# Patient Record
Sex: Male | Born: 2000 | Race: White | Hispanic: No | Marital: Single | Smoking: Never smoker
Health system: Southern US, Community
[De-identification: ages and names within clinical notes are randomized; demographics above are authoritative.]

## PROBLEM LIST (undated history)

## (undated) DIAGNOSIS — T7840XA Allergy, unspecified, initial encounter: Secondary | ICD-10-CM

## (undated) HISTORY — DX: Allergy, unspecified, initial encounter: T78.40XA

---

## 2021-05-11 ENCOUNTER — Encounter: Payer: Self-pay | Admitting: Family Medicine

## 2021-05-11 ENCOUNTER — Other Ambulatory Visit: Payer: Self-pay

## 2021-05-11 ENCOUNTER — Ambulatory Visit (INDEPENDENT_AMBULATORY_CARE_PROVIDER_SITE_OTHER): Payer: Medicaid Other | Admitting: Family Medicine

## 2021-05-11 VITALS — BP 131/68 | HR 62 | Ht 70.0 in | Wt 152.2 lb

## 2021-05-11 DIAGNOSIS — R079 Chest pain, unspecified: Secondary | ICD-10-CM

## 2021-05-11 NOTE — Progress Notes (Signed)
Established Patient Office Visit  SUBJECTIVE:  Subjective  Patient ID: Darrell Mcmahon, male    DOB: June 08, 2001  Age: 20 y.o. MRN: [id number]  CC:  Chief Complaint  Patient presents with  . New Patient (Initial Visit)    HPI Darrell Mcmahon is a 20 y.o. male presenting today for     History reviewed. No pertinent past medical history.  History reviewed. No pertinent surgical history.  History reviewed. No pertinent family history.  Social History   Socioeconomic History  . Marital status: Single    Spouse name: Not on file  . Number of children: Not on file  . Years of education: Not on file  . Highest education level: Not on file  Occupational History  . Not on file  Tobacco Use  . Smoking status: Never Smoker  . Smokeless tobacco: Never Used  Substance and Sexual Activity  . Alcohol use: Never  . Drug use: Never  . Sexual activity: Yes  Other Topics Concern  . Not on file  Social History Narrative  . Not on file   Social Determinants of Health   Financial Resource Strain: Not on file  Food Insecurity: Not on file  Transportation Needs: Not on file  Physical Activity: Not on file  Stress: Not on file  Social Connections: Not on file  Intimate Partner Violence: Not on file    No current outpatient medications on file.   Allergies  Allergen Reactions  . Penicillins     ROS Review of Systems  Constitutional: Negative.   HENT: Negative.   Respiratory: Negative.   Cardiovascular: Positive for chest pain.  Genitourinary: Negative.   Neurological: Negative.   Psychiatric/Behavioral: Negative.      OBJECTIVE:    Physical Exam HENT:     Head: Normocephalic and atraumatic.     Mouth/Throat:     Mouth: Mucous membranes are moist.  Cardiovascular:     Rate and Rhythm: Normal rate and regular rhythm.  Musculoskeletal:        General: Normal range of motion.  Skin:    General: Skin is warm.     Capillary Refill: Capillary refill takes less  than 2 seconds.  Neurological:     Mental Status: He is alert.  Psychiatric:        Mood and Affect: Mood normal.        Thought Content: Thought content normal.     BP 131/68   Pulse 62   Ht '5\' 10"'  (1.778 m)   Wt 152 lb 3.2 oz (69 kg)   BMI 21.84 kg/m  Wt Readings from Last 3 Encounters:  05/11/21 152 lb 3.2 oz (69 kg)    Health Maintenance Due  Topic Date Due  . HPV VACCINES (1 - Male 2-dose series) Never done  . HIV Screening  Never done  . Hepatitis C Screening  Never done  . TETANUS/TDAP  Never done       Topic Date Due  . HPV VACCINES (1 - Male 2-dose series) Never done    No flowsheet data found. No flowsheet data found.  No results found for: TSH No results found for: ALBUMIN, ANIONGAP, EGFR, GFR No results found for: CHOL, HDL, LDLCALC, CHOLHDL No results found for: TRIG No results found for: HGBA1C    ASSESSMENT & PLAN:   Problem List Items Addressed This Visit      Other   Chest pain - Primary    Patient with sharp chest pain for over 1  year now, happens intermittently and will last up to 1 hour. Sometimes it improves when he relaxes and breathes through the pain. He has had a workup in [place] including Echo and GERD meds that did not improve the pain or reveal any cause.   Plan- Chest X ray, labs If the results are inconclusive may try an SSRI although patient denies anxiety.       Relevant Orders   EKG 12-Lead   DG Chest 2 View   CBC with Differential   COMPLETE METABOLIC PANEL WITH GFR      No orders of the defined types were placed in this encounter.     Follow-up: No follow-ups on file.    Beckie Salts, [place] [address], [place], [place] [address]

## 2021-05-11 NOTE — Assessment & Plan Note (Signed)
Patient with sharp chest pain for over 1 year now, happens intermittently and will last up to 1 hour. Sometimes it improves when he relaxes and breathes through the pain. He has had a workup in [place] including Echo and GERD meds that did not improve the pain or reveal any cause.   Plan- Chest X ray, labs If the results are inconclusive may try an SSRI although patient denies anxiety.

## 2021-05-12 LAB — COMPLETE METABOLIC PANEL WITH GFR
AG Ratio: 1.7 (calc) (ref 1.0–2.5)
ALT: 92 U/L — ABNORMAL HIGH (ref 9–46)
AST: 219 U/L — ABNORMAL HIGH (ref 10–40)
Albumin: 4.1 g/dL (ref 3.6–5.1)
Alkaline phosphatase (APISO): 65 U/L (ref 36–130)
BUN: 14 mg/dL (ref 7–25)
CO2: 26 mmol/L (ref 20–32)
Calcium: 8.9 mg/dL (ref 8.6–10.3)
Chloride: 106 mmol/L (ref 98–110)
Creat: 0.98 mg/dL (ref 0.60–1.35)
GFR, Est African American: 128 mL/min/{1.73_m2} (ref >=60)
GFR, Est Non African American: 111 mL/min/{1.73_m2} (ref >=60)
Globulin: 2.4 g/dL (calc) (ref 1.9–3.7)
Glucose, Bld: 91 mg/dL (ref 65–99)
Potassium: 3.9 mmol/L (ref 3.5–5.3)
Sodium: 140 mmol/L (ref 135–146)
Total Bilirubin: 1.1 mg/dL (ref 0.2–1.2)
Total Protein: 6.5 g/dL (ref 6.1–8.1)

## 2021-05-12 LAB — CBC WITH DIFFERENTIAL/PLATELET
Absolute Monocytes: 402 cells/uL (ref 200–950)
Basophils Absolute: 41 cells/uL (ref 0–200)
Basophils Relative: 1 %
Eosinophils Absolute: 139 cells/uL (ref 15–500)
Eosinophils Relative: 3.4 %
HCT: 43.8 % (ref 38.5–50.0)
Hemoglobin: 14.6 g/dL (ref 13.2–17.1)
Lymphs Abs: 1394 cells/uL (ref 850–3900)
MCH: 28.9 pg (ref 27.0–33.0)
MCHC: 33.3 g/dL (ref 32.0–36.0)
MCV: 86.7 fL (ref 80.0–100.0)
MPV: 10.9 fL (ref 7.5–12.5)
Monocytes Relative: 9.8 %
Neutro Abs: 2124 cells/uL (ref 1500–7800)
Neutrophils Relative %: 51.8 %
Platelets: 193 10*3/uL (ref 140–400)
RBC: 5.05 10*6/uL (ref 4.20–5.80)
RDW: 12.3 % (ref 11.0–15.0)
Total Lymphocyte: 34 %
WBC: 4.1 10*3/uL (ref 3.8–10.8)

## 2021-05-16 ENCOUNTER — Encounter: Payer: Self-pay | Admitting: Family Medicine

## 2021-05-18 ENCOUNTER — Other Ambulatory Visit (INDEPENDENT_AMBULATORY_CARE_PROVIDER_SITE_OTHER): Payer: Medicaid Other | Admitting: Family Medicine

## 2021-05-18 ENCOUNTER — Ambulatory Visit
Admission: RE | Admit: 2021-05-18 | Discharge: 2021-05-18 | Disposition: A | Discharge status: Home / Self Care | Payer: Medicaid Other | Source: Ambulatory Visit | Attending: Family Medicine | Admitting: Family Medicine

## 2021-05-18 ENCOUNTER — Other Ambulatory Visit: Payer: Self-pay

## 2021-05-18 DIAGNOSIS — R748 Abnormal levels of other serum enzymes: Secondary | ICD-10-CM

## 2021-05-18 LAB — COMPLETE METABOLIC PANEL WITH GFR
AG Ratio: 1.8 (calc) (ref 1.0–2.5)
ALT: 63 U/L — ABNORMAL HIGH (ref 9–46)
AST: 27 U/L (ref 10–40)
Albumin: 4.4 g/dL (ref 3.6–5.1)
Alkaline phosphatase (APISO): 72 U/L (ref 36–130)
BUN: 15 mg/dL (ref 7–25)
CO2: 28 mmol/L (ref 20–32)
Calcium: 9.5 mg/dL (ref 8.6–10.3)
Chloride: 102 mmol/L (ref 98–110)
Creat: 1.11 mg/dL (ref 0.60–1.35)
GFR, Est African American: 110 mL/min/{1.73_m2} (ref >=60)
GFR, Est Non African American: 95 mL/min/{1.73_m2} (ref >=60)
Globulin: 2.4 g/dL (calc) (ref 1.9–3.7)
Glucose, Bld: 100 mg/dL — ABNORMAL HIGH (ref 65–99)
Potassium: 4.3 mmol/L (ref 3.5–5.3)
Sodium: 138 mmol/L (ref 135–146)
Total Bilirubin: 1 mg/dL (ref 0.2–1.2)
Total Protein: 6.8 g/dL (ref 6.1–8.1)

## 2021-05-18 LAB — AMYLASE: Amylase: 42 U/L (ref 21–101)

## 2021-05-18 LAB — LIPID PANEL
Cholesterol: 128 mg/dL (ref <200)
HDL: 45 mg/dL (ref >=40)
LDL Cholesterol (Calc): 66 mg/dL (calc)
Non-HDL Cholesterol (Calc): 83 mg/dL (calc) (ref <130)
Total CHOL/HDL Ratio: 2.8 (calc) (ref <5.0)
Triglycerides: 88 mg/dL (ref <150)

## 2021-05-18 LAB — LIPASE: Lipase: 19 U/L (ref 7–60)

## 2021-06-08 ENCOUNTER — Ambulatory Visit: Payer: Medicaid Other | Admitting: Family Medicine

## 2021-06-08 IMAGING — US US ABDOMEN COMPLETE
1 series · 14 of 25 positions shown · non-contrast
Comparison: None.

CLINICAL DATA: Elevated LFTs

EXAM:
ABDOMEN ULTRASOUND COMPLETE

[Series 1: us abdomen complete · 14 of 110 slices shown]
[im 1/110]
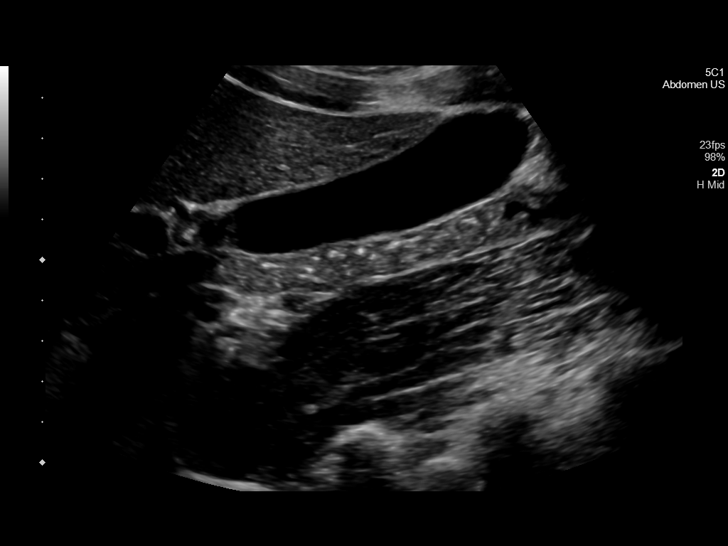
[im 10/110]
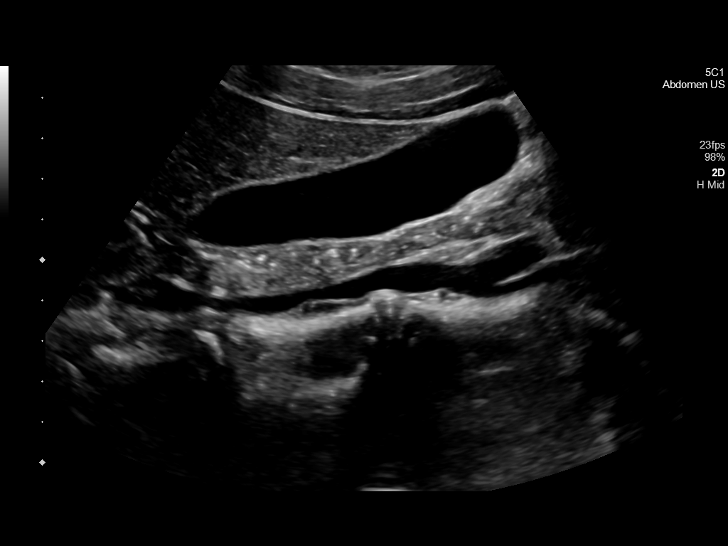
[im 19/110]
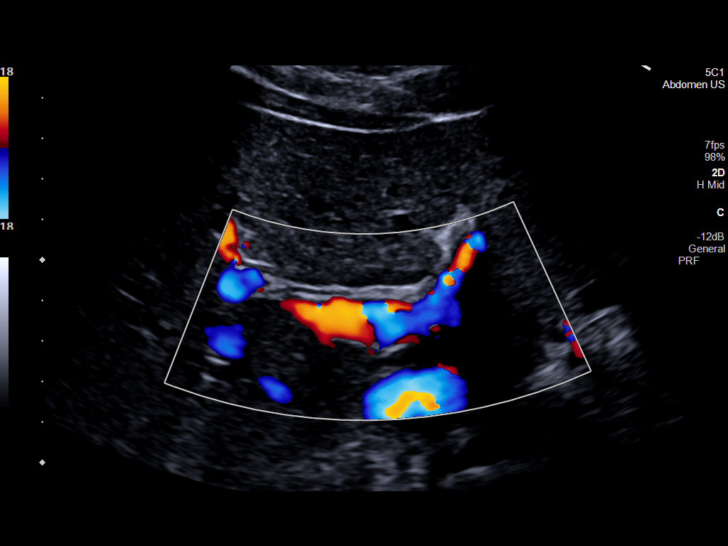
[im 28/110]
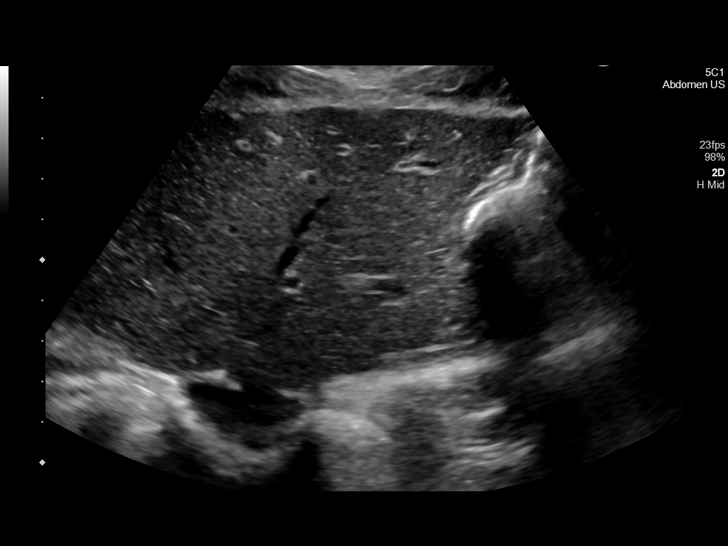
[im 37/110]
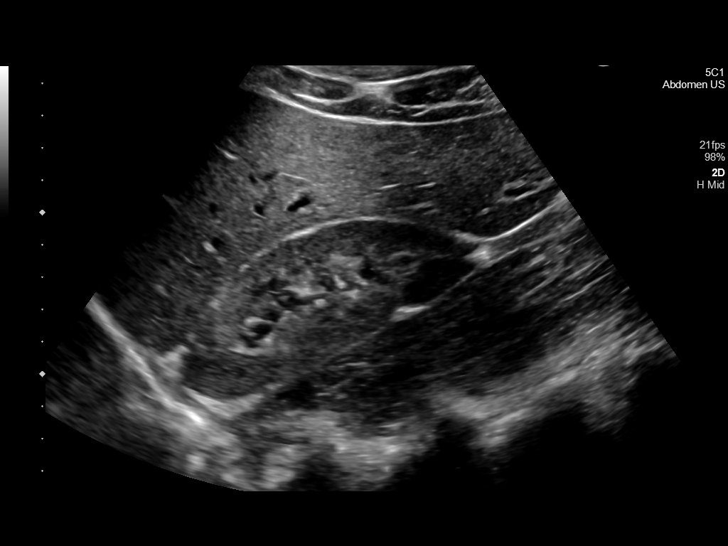
[im 41/110]
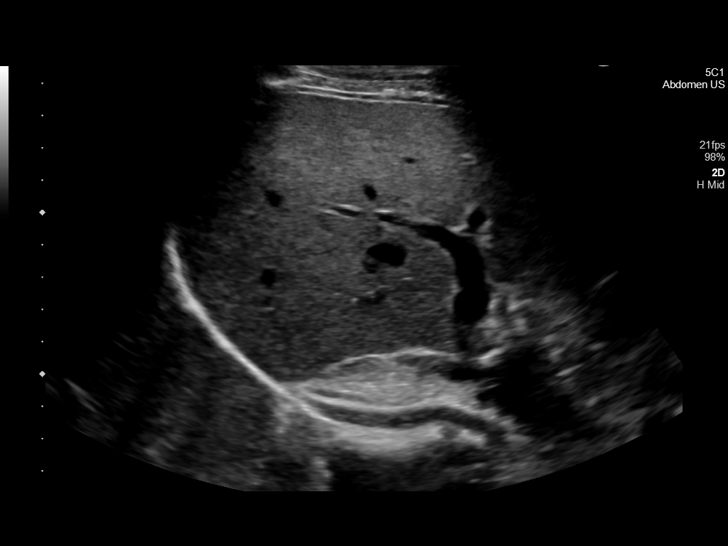
[im 50/110]
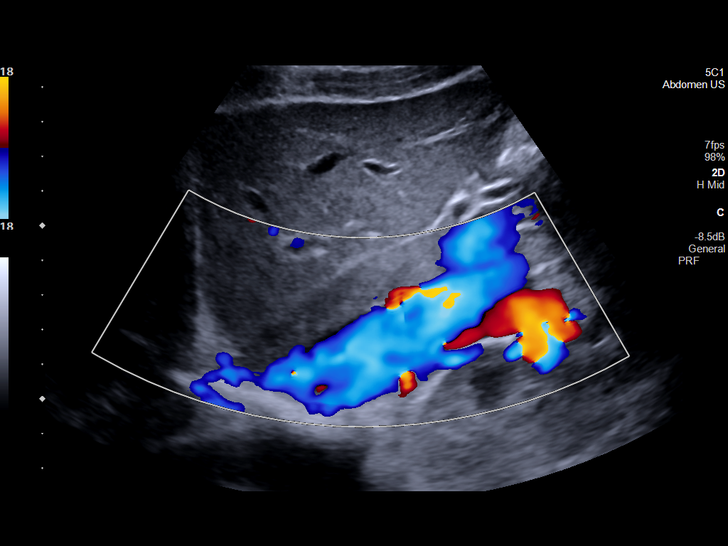
[im 60/110]
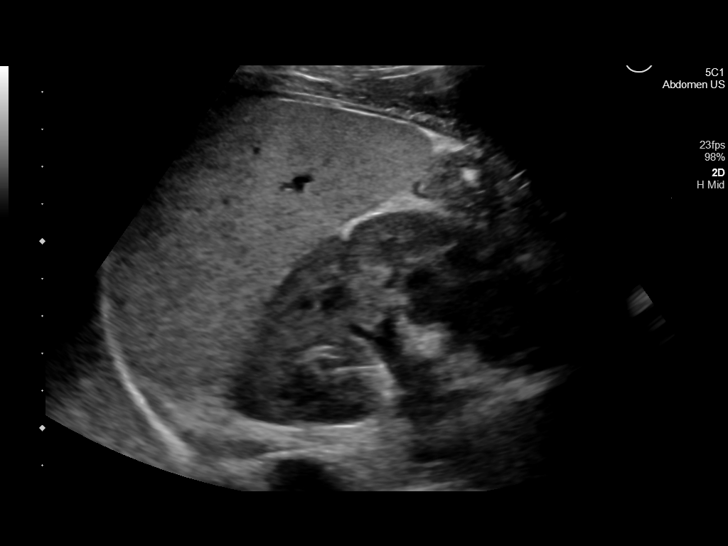
[im 69/110]
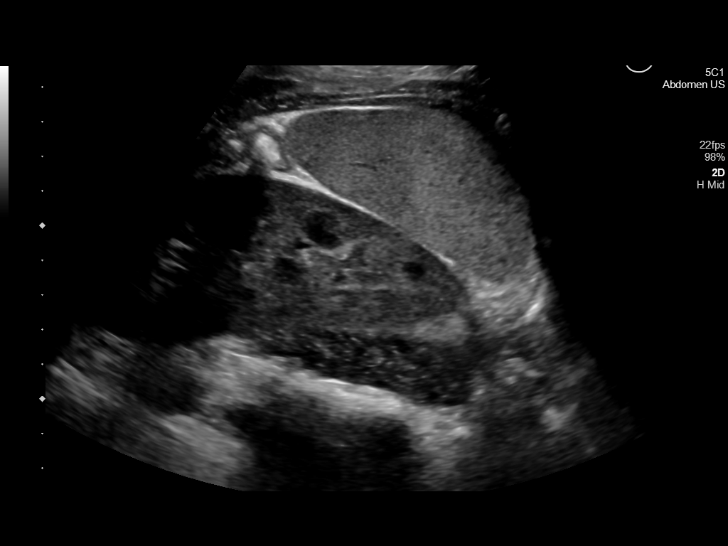
[im 73/110]
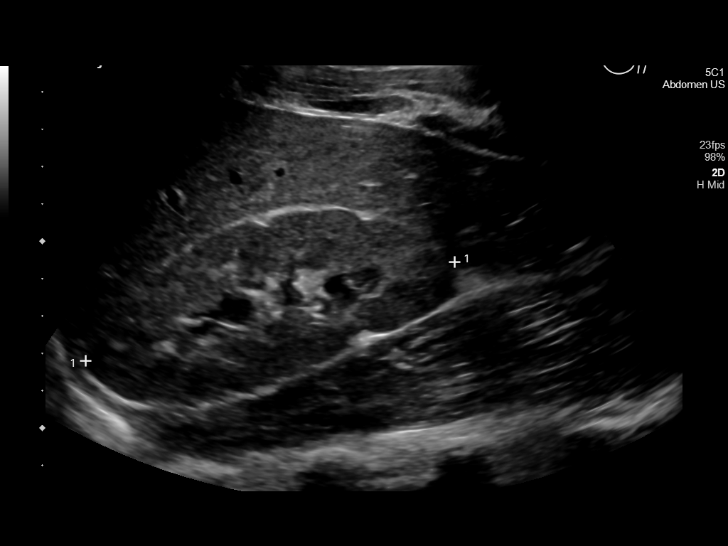
[im 82/110]
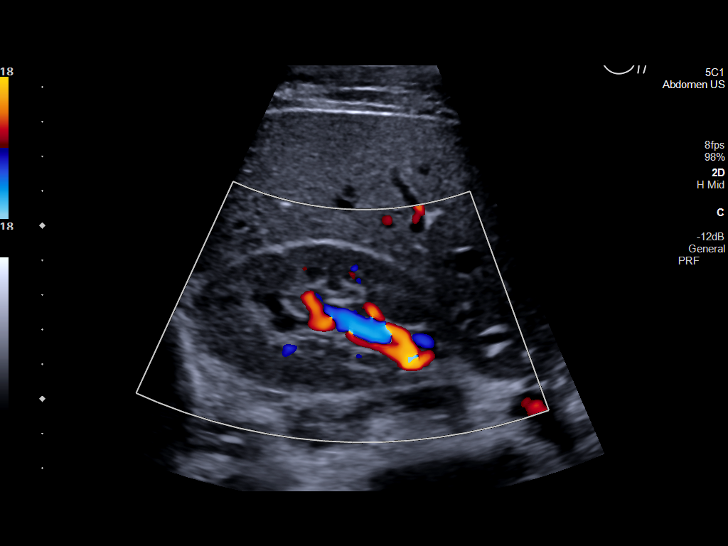
[im 91/110]
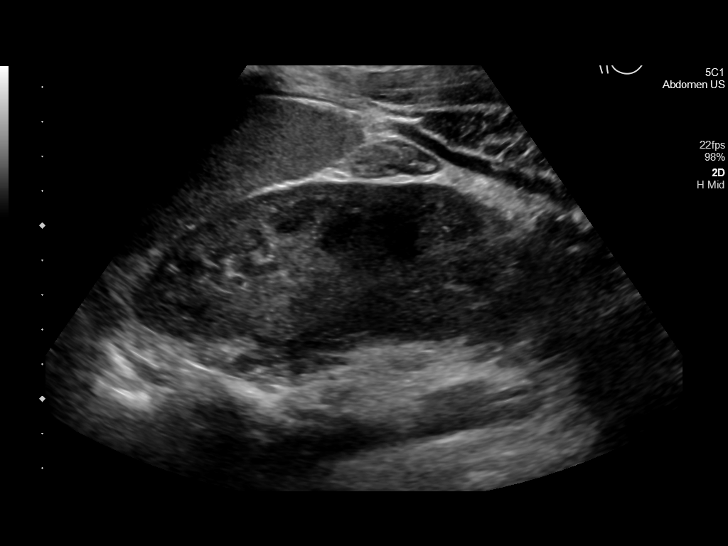
[im 100/110]
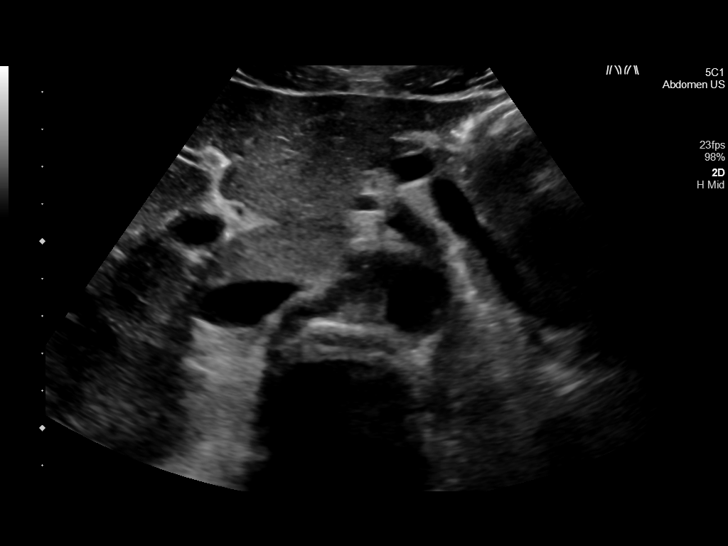
[im 110/110]
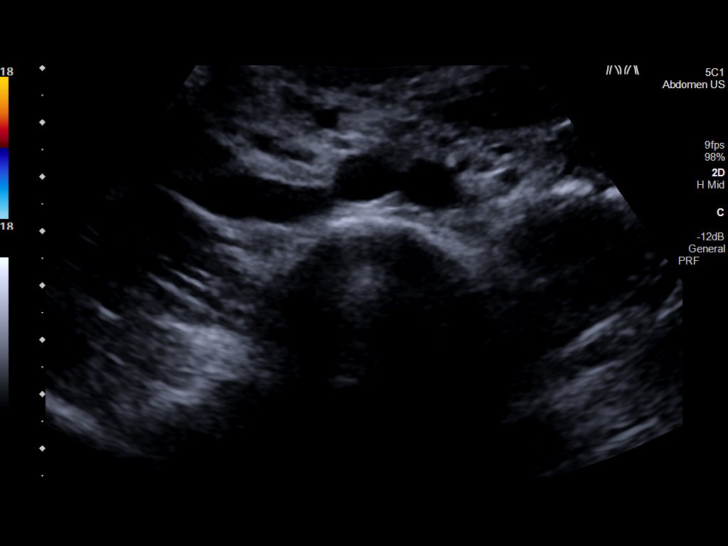

[14 of 25 positions shown; findings below may reference images not displayed]

FINDINGS: Gallbladder: No gallstones or wall thickening visualized. No
sonographic Murphy sign noted by sonographer.

Common bile duct: Diameter: 2 mm

Liver: No focal lesion identified. Within normal limits in
parenchymal echogenicity. Portal vein is patent on color Doppler
imaging with normal direction of blood flow towards the liver.

IVC: No abnormality visualized.

Pancreas: Visualized portion unremarkable.

Spleen: Size and appearance within normal limits.

Right Kidney: Length: 10.2 cm. Echogenicity within normal limits. No
mass or hydronephrosis visualized.

Left Kidney: Length: 11.2 cm. Echogenicity within normal limits. No
mass or hydronephrosis visualized.

Abdominal aorta: No aneurysm visualized.

Other findings: None.
IMPRESSION: Unremarkable ultrasound of the abdomen.

## 2022-01-10 ENCOUNTER — Encounter: Payer: Self-pay | Admitting: Internal Medicine

## 2022-01-18 ENCOUNTER — Other Ambulatory Visit: Payer: Self-pay | Admitting: Internal Medicine

## 2022-01-18 DIAGNOSIS — R079 Chest pain, unspecified: Secondary | ICD-10-CM

## 2022-01-21 ENCOUNTER — Ambulatory Visit: Admission: RE | Admit: 2022-01-21 | Payer: Medicaid Other | Source: Ambulatory Visit

## 2022-01-28 ENCOUNTER — Ambulatory Visit: Admission: RE | Admit: 2022-01-28 | Payer: Medicaid Other | Source: Ambulatory Visit

## 2022-02-04 ENCOUNTER — Ambulatory Visit: Admission: RE | Admit: 2022-02-04 | Payer: Medicaid Other | Source: Ambulatory Visit

## 2022-02-15 ENCOUNTER — Other Ambulatory Visit: Payer: Medicaid Other

## 2022-02-15 ENCOUNTER — Ambulatory Visit
Admission: RE | Admit: 2022-02-15 | Discharge: 2022-02-15 | Disposition: A | Discharge status: Home / Self Care | Payer: Medicaid Other | Source: Ambulatory Visit | Attending: Internal Medicine | Admitting: Internal Medicine

## 2022-02-15 ENCOUNTER — Other Ambulatory Visit: Payer: Self-pay | Admitting: Internal Medicine

## 2022-02-15 DIAGNOSIS — R079 Chest pain, unspecified: Secondary | ICD-10-CM

## 2022-03-19 ENCOUNTER — Other Ambulatory Visit: Payer: Self-pay | Admitting: Nurse Practitioner

## 2022-03-19 DIAGNOSIS — R7989 Other specified abnormal findings of blood chemistry: Secondary | ICD-10-CM

## 2022-03-19 DIAGNOSIS — R0789 Other chest pain: Secondary | ICD-10-CM

## 2022-03-28 ENCOUNTER — Ambulatory Visit: Admission: RE | Admit: 2022-03-28 | Payer: Medicaid Other | Source: Ambulatory Visit

## 2022-04-03 ENCOUNTER — Encounter: Payer: Self-pay | Admitting: Emergency Medicine

## 2022-04-03 ENCOUNTER — Emergency Department
Admission: EM | Admit: 2022-04-03 | Discharge: 2022-04-03 | Disposition: A | Discharge status: Home / Self Care | Payer: Medicaid Other | Attending: Student in an Organized Health Care Education/Training Program | Admitting: Student in an Organized Health Care Education/Training Program

## 2022-04-03 ENCOUNTER — Other Ambulatory Visit: Payer: Self-pay

## 2022-04-03 ENCOUNTER — Emergency Department: Payer: Medicaid Other

## 2022-04-03 DIAGNOSIS — R0789 Other chest pain: Secondary | ICD-10-CM | POA: Diagnosis present

## 2022-04-03 LAB — CBC
HCT: 44 % (ref 39.0–52.0)
Hemoglobin: 15.1 g/dL (ref 13.0–17.0)
MCH: 29.2 pg (ref 26.0–34.0)
MCHC: 34.3 g/dL (ref 30.0–36.0)
MCV: 85.1 fL (ref 80.0–100.0)
Platelets: 173 10*3/uL (ref 150–400)
RBC: 5.17 MIL/uL (ref 4.22–5.81)
RDW: 11.9 % (ref 11.5–15.5)
WBC: 4.2 10*3/uL (ref 4.0–10.5)
nRBC: 0 % (ref 0.0–0.2)

## 2022-04-03 LAB — COMPREHENSIVE METABOLIC PANEL
ALT: 21 U/L (ref 0–44)
AST: 17 U/L (ref 15–41)
Albumin: 4 g/dL (ref 3.5–5.0)
Alkaline Phosphatase: 58 U/L (ref 38–126)
Anion gap: 5 (ref 5–15)
BUN: 13 mg/dL (ref 6–20)
CO2: 26 mmol/L (ref 22–32)
Calcium: 8.9 mg/dL (ref 8.9–10.3)
Chloride: 106 mmol/L (ref 98–111)
Creatinine, Ser: 0.94 mg/dL (ref 0.61–1.24)
GFR, Estimated: >60 mL/min (ref >60)
Glucose, Bld: 104 mg/dL — ABNORMAL HIGH (ref 70–99)
Potassium: 4.1 mmol/L (ref 3.5–5.1)
Sodium: 137 mmol/L (ref 135–145)
Total Bilirubin: 0.9 mg/dL (ref 0.3–1.2)
Total Protein: 7 g/dL (ref 6.5–8.1)

## 2022-04-03 LAB — LIPASE, BLOOD: Lipase: 35 U/L (ref 11–51)

## 2022-04-03 LAB — TROPONIN I (HIGH SENSITIVITY): Troponin I (High Sensitivity): 4 ng/L (ref <18)

## 2022-04-03 MED ORDER — NAPROXEN 500 MG PO TABS
500.0000 mg | ORAL_TABLET | Freq: Once | ORAL | Status: AC
  Administered 2022-04-03: 500 mg via ORAL
  Filled 2022-04-03: qty 1

## 2022-04-03 NOTE — ED Provider Notes (Signed)
? ?[facility] ?Provider Note ? ? ? Event Date/Time  ? First MD Initiated Contact with Patient 04/03/22 [phone]   ?  (approximate) ? ? ?History  ? ?Chest Pain ? ? ?HPI ? ?Darrell Mcmahon is a 21 y.o. male with a history of recurrent chest wall pain presents to the ER for evaluation of left-sided chest pain for the past 2 days.  No fevers no pleuritic pain.  Is worse with movement.  Has been evaluated by cardiology but no noncardiac chest pain.  Recently placed on famotidine for suspected gastritis.  Denies any epigastric pain no lower abdominal pain.  No pain radiating through to the back.  Denies any injury no fevers or chills denies any history of asthma or bronchitis.  Does not smoke. ?  ? ? ?Physical Exam  ? ?Triage Vital Signs: ?ED Triage Vitals [04/03/22 0834]  ?Enc Vitals Group  ?   BP 127/76  ?   Pulse Rate 70  ?   Resp 20  ?   Temp 98.3 ?F (36.8 ?C)  ?   Temp Source Oral  ?   SpO2 100 %  ?   Weight 158 lb (71.7 kg)  ?   Height 5\' 10"  (1.778 m)  ?   Head Circumference   ?   Peak Flow   ?   Pain Score 8  ?   Pain Loc   ?   Pain Edu?   ?   Excl. in [place]?   ? ? ?Most recent vital signs: ?Vitals:  ? 04/03/22 0834 04/03/22 0949  ?BP: 127/76 130/70  ?Pulse: 70 72  ?Resp: 20 18  ?Temp: 98.3 ?F (36.8 ?C)   ?SpO2: 100% 100%  ? ? ? ?Constitutional: Alert  ?Eyes: Conjunctivae are normal.  ?Head: Atraumatic. ?Nose: No congestion/rhinnorhea. ?Mouth/Throat: Mucous membranes are moist.   ?Neck: Painless ROM.  ?Cardiovascular:   Good peripheral circulation. No m/g/r ?Respiratory: Normal respiratory effort.  No retractions. No crackles, no wheeze ?Gastrointestinal: Soft and nontender.  ?Musculoskeletal:  no deformity ?Neurologic:  MAE spontaneously. No gross focal neurologic deficits are appreciated.  ?Skin:  Skin is warm, dry and intact. No rash noted. ?Psychiatric: Mood and affect are normal. Speech and behavior are normal. ? ? ? ?ED Results / Procedures / Treatments  ? ?Labs ?(all labs ordered are listed,  but only abnormal results are displayed) ?Labs Reviewed  ?COMPREHENSIVE METABOLIC PANEL - Abnormal; Notable for the following components:  ?    Result Value  ? Glucose, Bld 104 (*)   ? All other components within normal limits  ?CBC  ?LIPASE, BLOOD  ?TROPONIN I (HIGH SENSITIVITY)  ? ? ? ?EKG ? ?ED ECG REPORT ?I, Merlyn Lot, the attending physician, personally viewed and interpreted this ECG. ? ? Date: 04/03/2022 ? EKG Time: 8:31 ? Rate: 70 ? Rhythm: sinus ? Axis: right ? Intervals:normal ? ST&T Change: no brugada or wpw, no stemi, no depression, unchanged from previous tracing ? ? ? ?RADIOLOGY ?Please see ED Course for my review and interpretation. ? ?I personally reviewed all radiographic images ordered to evaluate for the above acute complaints and reviewed radiology reports and findings.  These findings were personally discussed with the patient.  Please see medical record for radiology report. ? ? ? ?PROCEDURES: ? ?Critical Care performed:  ? ?Procedures ? ? ?MEDICATIONS ORDERED IN ED: ?Medications  ?naproxen (NAPROSYN) tablet 500 mg (500 mg Oral Given 04/03/22 0916)  ? ? ? ?IMPRESSION / MDM / ASSESSMENT AND PLAN /  ED COURSE  ?I reviewed the triage vital signs and the nursing notes. ?             ?               ? ?Differential diagnosis includes, but is not limited to, ACS, pericarditis, esophagitis,, pe, dissection, pna, bronchitis, costochondritis ? ?Patient presented to ER with symptoms as described above.  Clinically is not consistent with dissection or ACS.  No findings to suggest bronchitis.  Possible esophagitis or gastritis given his history of reflux but seems very reproducible with movement.  Possible pleurisy.  Patient is low risk by Wells criteria and is PERC negative.  Not consistent with PE.  EKG unchanged from previous and has had reassuring cardiac work-up. ? ?Clinical Course as of 04/03/22 0952  ?Wed Apr 03, 2022  ?0916 This x-ray by my review and interpretation does not show any evidence  of pneumothorax.  No cardiomegaly. [PR]  ?  ?Clinical Course User Index ?[PR] Merlyn Lot, MD  ? ?Blood work reassuring troponin normal LFTs normal lipase normal.  This is most likely musculoskeletal pain versus pleurisy does appear stable and appropriate for outpatient follow-up. ? ? ?FINAL CLINICAL IMPRESSION(S) / ED DIAGNOSES  ? ?Final diagnoses:  ?Chest wall pain  ? ? ? ?Rx / DC Orders  ? ?ED Discharge Orders   ? ? None  ? ?  ? ? ? ?Note:  This document was prepared using Dragon voice recognition software and may include unintentional dictation errors. ? ?  ?Merlyn Lot, MD ?04/03/22 [phone] ? ?

## 2022-04-03 NOTE — ED Triage Notes (Signed)
Pt in with co left sided chest pain for years, states has been evaluated for the same and tx for reflux. Pt states pain worse with certain movements.  ?

## 2022-04-24 IMAGING — CR DG CHEST 2V
2 series · 2 of 2 positions shown · non-contrast
Comparison: 02/15/2022

CLINICAL DATA: Left-sided chest pain.  Evaluate for pneumothorax.

EXAM:
CHEST - 2 VIEW

[chest pa]
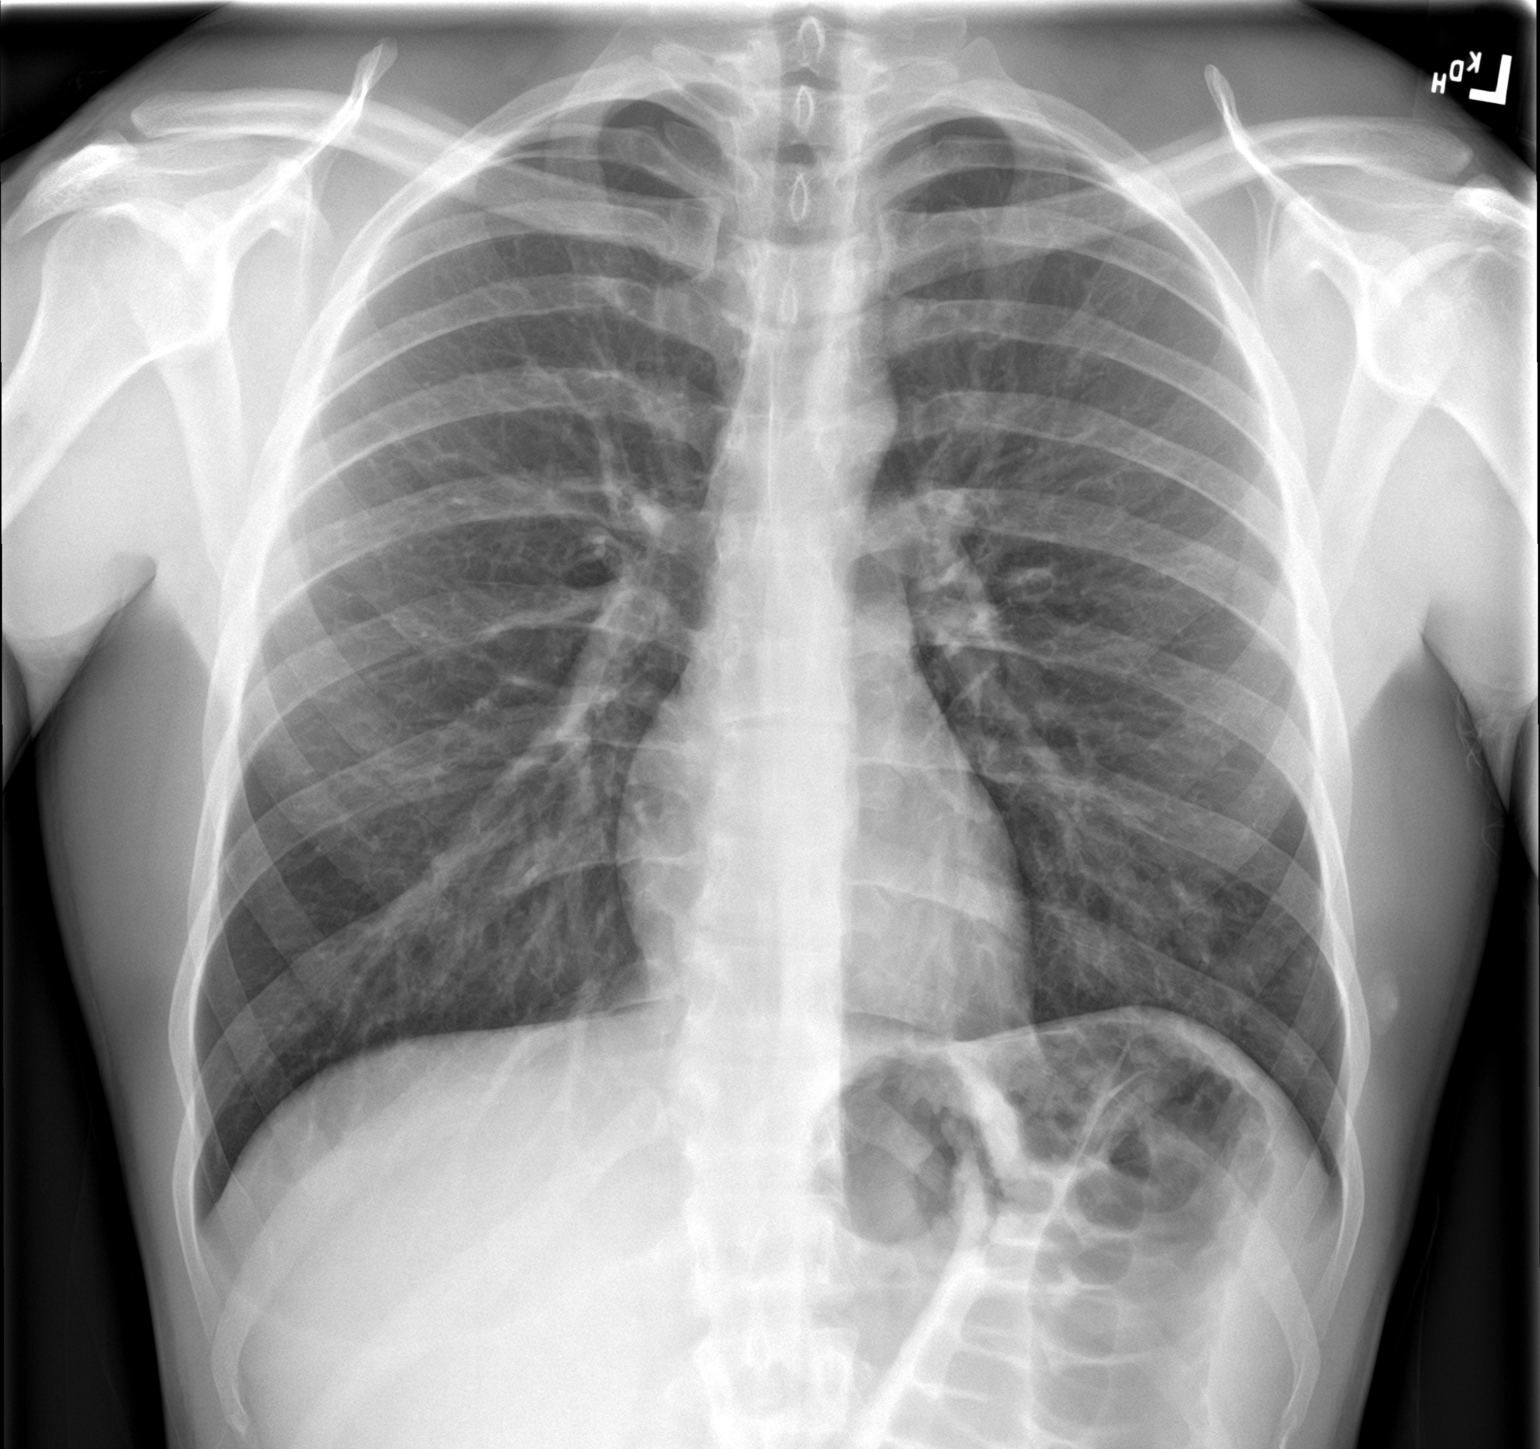

[chest lat]
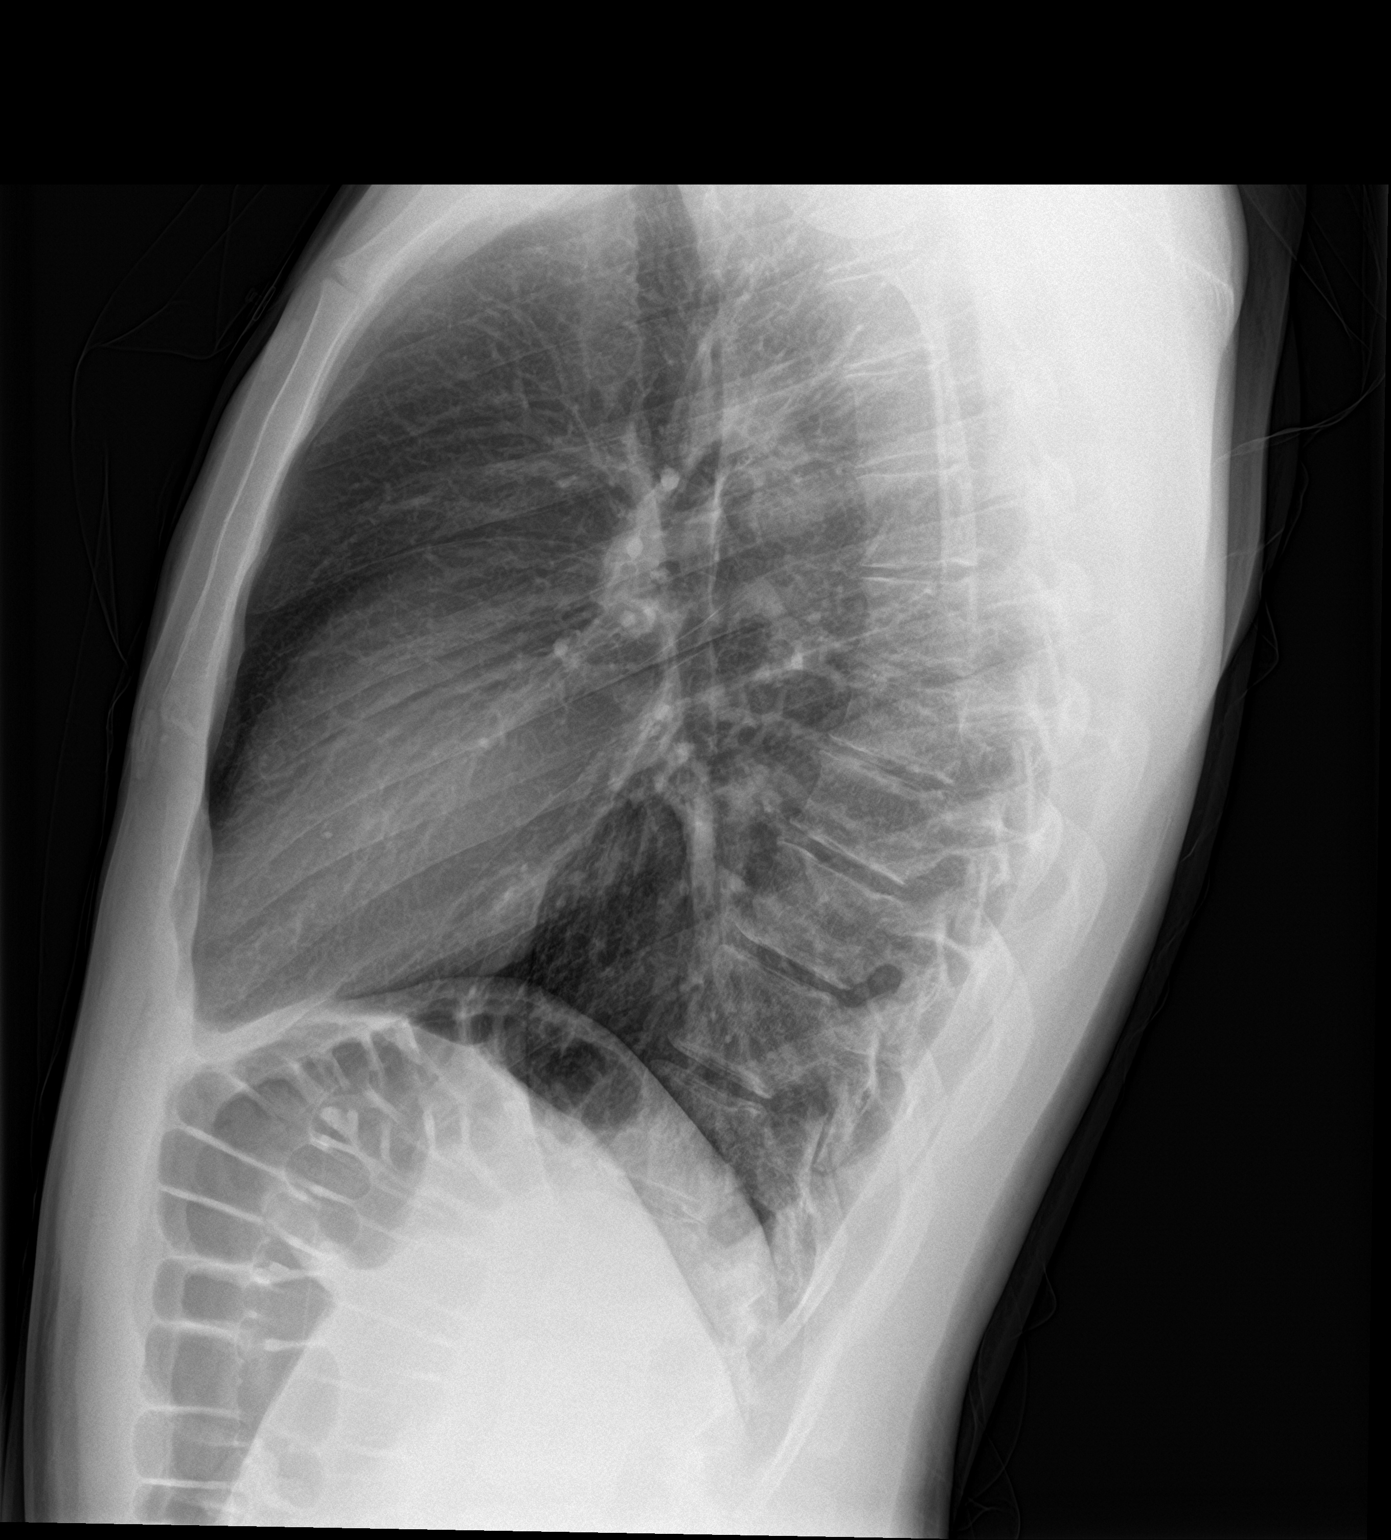

[2 of 2 positions shown; findings below may reference images not displayed]

FINDINGS: Grossly unchanged cardiac silhouette and mediastinal contours. No
focal parenchymal opacities. No pleural effusion or pneumothorax. No
evidence of edema. No acute osseous abnormalities. Mild gaseous
distention of the splenic flexure of the colon.
IMPRESSION: 1. No acute cardiopulmonary disease.  Specifically, no pneumothorax.
2. Incidentally noted nonspecific mild gaseous distention of the
splenic flexure of the colon.

## 2023-12-11 ENCOUNTER — Emergency Department
Admission: EM | Admit: 2023-12-11 | Discharge: 2023-12-11 | Disposition: A | Discharge status: Home / Self Care | Payer: Medicaid Other | Attending: Emergency Medicine | Admitting: Emergency Medicine

## 2023-12-11 ENCOUNTER — Encounter: Payer: Self-pay | Admitting: Emergency Medicine

## 2023-12-11 ENCOUNTER — Other Ambulatory Visit: Payer: Self-pay

## 2023-12-11 ENCOUNTER — Emergency Department: Payer: Medicaid Other

## 2023-12-11 DIAGNOSIS — R0789 Other chest pain: Secondary | ICD-10-CM | POA: Insufficient documentation

## 2023-12-11 DIAGNOSIS — R079 Chest pain, unspecified: Secondary | ICD-10-CM

## 2023-12-11 LAB — HEPATIC FUNCTION PANEL
ALT: 28 U/L (ref 0–44)
AST: 19 U/L (ref 15–41)
Albumin: 4.1 g/dL (ref 3.5–5.0)
Alkaline Phosphatase: 54 U/L (ref 38–126)
Bilirubin, Direct: 0.1 mg/dL (ref 0.0–0.2)
Indirect Bilirubin: 0.7 mg/dL (ref 0.3–0.9)
Total Bilirubin: 0.8 mg/dL (ref <1.2)
Total Protein: 6.8 g/dL (ref 6.5–8.1)

## 2023-12-11 LAB — CBC
HCT: 44.8 % (ref 39.0–52.0)
Hemoglobin: 15.6 g/dL (ref 13.0–17.0)
MCH: 28.9 pg (ref 26.0–34.0)
MCHC: 34.8 g/dL (ref 30.0–36.0)
MCV: 83 fL (ref 80.0–100.0)
Platelets: 192 10*3/uL (ref 150–400)
RBC: 5.4 MIL/uL (ref 4.22–5.81)
RDW: 11.8 % (ref 11.5–15.5)
WBC: 4.8 10*3/uL (ref 4.0–10.5)
nRBC: 0 % (ref 0.0–0.2)

## 2023-12-11 LAB — BASIC METABOLIC PANEL
Anion gap: 9 (ref 5–15)
BUN: 15 mg/dL (ref 6–20)
CO2: 25 mmol/L (ref 22–32)
Calcium: 8.9 mg/dL (ref 8.9–10.3)
Chloride: 104 mmol/L (ref 98–111)
Creatinine, Ser: 1.06 mg/dL (ref 0.61–1.24)
GFR, Estimated: >60 mL/min (ref >60)
Glucose, Bld: 106 mg/dL — ABNORMAL HIGH (ref 70–99)
Potassium: 3.9 mmol/L (ref 3.5–5.1)
Sodium: 138 mmol/L (ref 135–145)

## 2023-12-11 LAB — TROPONIN I (HIGH SENSITIVITY): Troponin I (High Sensitivity): 6 ng/L (ref <18)

## 2023-12-11 MED ORDER — KETOROLAC TROMETHAMINE 30 MG/ML IJ SOLN
30.0000 mg | Freq: Once | INTRAMUSCULAR | Status: AC
Start: 2023-12-11 — End: 2023-12-11
  Administered 2023-12-11: 30 mg via INTRAMUSCULAR
  Filled 2023-12-11: qty 1

## 2023-12-11 NOTE — ED Triage Notes (Signed)
Pt here with cp x 3 days. Pt states pain is left sided and sharp when he breathes. Pt states pain is worse in the mornings. Pt denies NVD.

## 2023-12-11 NOTE — Discharge Instructions (Signed)
You are seen in the emergency department for chest pain.  Your chest x-ray was normal with no signs of pneumonia.  Your heart enzyme was normal do not believe you are having a heart attack today.  Bedside ultrasound of your heart did not show any abnormalities.  Alternate ibuprofen and Tylenol for pain control.  Follow-up with your primary care physician and cardiologist.  Return to the emergency department for any ongoing or worsening symptoms.  Pain control:  Ibuprofen (motrin/aleve/advil) - You can take 3 tablets (600 mg) every 6 hours as needed for pain/fever.  Acetaminophen (tylenol) - You can take 2 extra strength tablets (1000 mg) every 6 hours as needed for pain/fever.  You can alternate these medications or take them together.  Make sure you eat food/drink water when taking these medications.  Thank you for choosing [place] for your health care, it was my pleasure to care for you today!  Corena Herter, MD

## 2023-12-11 NOTE — ED Provider Notes (Signed)
[facility] Provider Note    Event Date/Time   First MD Initiated Contact with Patient 12/11/23 1015     (approximate)   History   Chest Pain   HPI  Darrell Mcmahon is a 22 y.o. male no significant past medical history who presents to the emergency department with chest pain.  Endorses left-sided chest pain that is a dull pressure pain and intermittently sharp and stabbing.  Worse with movement and laying on the side.  Some improvement with laying down and resting.  No association with shortness of breath, nausea or vomiting.  Patient states that he has had this in the past and has been worked up without a diagnosis.  Does state that he has been evaluated by cardiology, had a stress testing done and an echocardiogram.  Also has been seen by GI for possibility esophageal spasms.  No family history of cardiac disease at a young age.  Denies any significant alcohol use or marijuana use.  Denies drug use.     Physical Exam   Triage Vital Signs: ED Triage Vitals  Encounter Vitals Group     BP 12/11/23 1008 123/71     Systolic BP Percentile --      Diastolic BP Percentile --      Pulse Rate 12/11/23 1008 69     Resp 12/11/23 1008 16     Temp 12/11/23 1008 98.4 F (36.9 C)     Temp Source 12/11/23 1008 Oral     SpO2 12/11/23 1008 100 %     Weight 12/11/23 1006 158 lb 1.1 oz (71.7 kg)     Height 12/11/23 1006 5\' 10"  (1.778 m)     Head Circumference --      Peak Flow --      Pain Score 12/11/23 1006 6     Pain Loc --      Pain Education --      Exclude from Growth Chart --     Most recent vital signs: Vitals:   12/11/23 1008 12/11/23 1102  BP: 123/71   Pulse: 69   Resp: 16   Temp: 98.4 F (36.9 C)   SpO2: 100% 100%    Physical Exam Constitutional:      Appearance: He is well-developed.  HENT:     Head: Atraumatic.  Eyes:     Conjunctiva/sclera: Conjunctivae normal.  Cardiovascular:     Rate and Rhythm: Regular rhythm.     Heart sounds:  Normal heart sounds.  Pulmonary:     Effort: No respiratory distress.  Abdominal:     Palpations: Abdomen is soft.     Tenderness: There is no abdominal tenderness.  Musculoskeletal:        General: Normal range of motion.     Cervical back: Normal range of motion.     Right lower leg: No edema.     Left lower leg: No edema.  Skin:    General: Skin is warm.     Capillary Refill: Capillary refill takes less than 2 seconds.  Neurological:     Mental Status: He is alert. Mental status is at baseline.     IMPRESSION / MDM / ASSESSMENT AND PLAN / ED COURSE  I reviewed the triage vital signs and the nursing notes.  On chart review patient has been evaluated by Dr. Juliann Pares in the past had a stress test, Holter monitor and echocardiogram all of which were unremarkable other than 2 episodes of nonsustained SVT.  Was  given a referral to gastroenterology and has been evaluated by gastroenterology.  Differential diagnosis including pleurisy, costochondritis, pneumonia, pericarditis, ACS, anemia  EKG  I, Corena Herter, the attending physician, personally viewed and interpreted this ECG.   Rate: Normal  Rhythm: Normal sinus  Axis: Normal  Intervals: Normal  ST&T Change: None   RADIOLOGY I independently reviewed imaging, my interpretation of imaging: Chest x-ray with no signs of pneumonia  LABS (all labs ordered are listed, but only abnormal results are displayed) Labs interpreted as -    Labs Reviewed  BASIC METABOLIC PANEL - Abnormal; Notable for the following components:      Result Value   Glucose, Bld 106 (*)    All other components within normal limits  CBC  HEPATIC FUNCTION PANEL  TROPONIN I (HIGH SENSITIVITY)     MDM    Low risk Wells criteria and PERC negative, have low suspicion for pulmonary embolism.  Patient was given IM ketorolac.  EKG without consistent findings concerning for acute pericarditis, no friction rub, no pericardial effusion on bedside  echocardiogram.  No signs of heart failure.  Clinical picture is not consistent with dissection has equal pulses in all extremities.  Most likely with pleurisy.  Discussed close follow-up with his cardiologist and primary care provider.  Given return precautions for any ongoing or worsening symptoms.  Chest pain resolved on reevaluation.  Discussed symptomatic treatment with ibuprofen and Tylenol and return precautions.   PROCEDURES:  Critical Care performed: No  Ultrasound ED Echo  Date/Time: 12/11/2023 12:13 PM  Performed by: Corena Herter, MD Authorized by: Corena Herter, MD   Procedure details:    Indications: chest pain     Views: subxiphoid, parasternal long axis view, parasternal short axis view, apical 4 chamber view and IVC view     Images: not archived     Limitations:  Positioning and acoustic shadowing Findings:    Pericardium: no pericardial effusion     LV Function: normal (>50% EF)     RV Diameter: normal     IVC: normal   Impression:    Impression: normal     Patient's presentation is most consistent with acute presentation with potential threat to life or bodily function.   MEDICATIONS ORDERED IN ED: Medications  ketorolac (TORADOL) 30 MG/ML injection 30 mg (30 mg Intramuscular Given 12/11/23 1115)    FINAL CLINICAL IMPRESSION(S) / ED DIAGNOSES   Final diagnoses:  Chest pain, unspecified type     Rx / DC Orders   ED Discharge Orders     None        Note:  This document was prepared using Dragon voice recognition software and may include unintentional dictation errors.   Corena Herter, MD 12/11/23 1215

## 2024-08-03 ENCOUNTER — Ambulatory Visit: Admitting: Family Medicine

## 2024-08-03 ENCOUNTER — Telehealth: Payer: Self-pay | Admitting: Pulmonary Disease

## 2024-08-03 ENCOUNTER — Encounter: Payer: Self-pay | Admitting: Family Medicine

## 2024-08-03 VITALS — BP 114/69 | HR 70 | Temp 98.6°F | Ht 70.5 in | Wt 161.5 lb

## 2024-08-03 DIAGNOSIS — Z1329 Encounter for screening for other suspected endocrine disorder: Secondary | ICD-10-CM | POA: Diagnosis not present

## 2024-08-03 DIAGNOSIS — R5382 Chronic fatigue, unspecified: Secondary | ICD-10-CM | POA: Diagnosis not present

## 2024-08-03 DIAGNOSIS — Z13228 Encounter for screening for other metabolic disorders: Secondary | ICD-10-CM

## 2024-08-03 DIAGNOSIS — Z0000 Encounter for general adult medical examination without abnormal findings: Secondary | ICD-10-CM | POA: Diagnosis not present

## 2024-08-03 DIAGNOSIS — Z820 Family history of epilepsy and other diseases of the nervous system: Secondary | ICD-10-CM | POA: Diagnosis not present

## 2024-08-03 DIAGNOSIS — Z136 Encounter for screening for cardiovascular disorders: Secondary | ICD-10-CM

## 2024-08-03 DIAGNOSIS — Z1159 Encounter for screening for other viral diseases: Secondary | ICD-10-CM

## 2024-08-03 DIAGNOSIS — R053 Chronic cough: Secondary | ICD-10-CM | POA: Diagnosis not present

## 2024-08-03 DIAGNOSIS — R413 Other amnesia: Secondary | ICD-10-CM

## 2024-08-03 DIAGNOSIS — Z130 Encounter for screening for diseases of the blood and blood-forming organs and certain disorders involving the immune mechanism: Secondary | ICD-10-CM

## 2024-08-03 DIAGNOSIS — Z114 Encounter for screening for human immunodeficiency virus [HIV]: Secondary | ICD-10-CM

## 2024-08-03 MED ORDER — ALBUTEROL SULFATE HFA 108 (90 BASE) MCG/ACT IN AERS: 2.0000 | INHALATION_SPRAY | Freq: Four times a day (QID) | RESPIRATORY_TRACT | 2 refills | Status: AC | PRN

## 2024-08-03 MED ORDER — LORATADINE 10 MG PO TABS: 10.0000 mg | ORAL_TABLET | Freq: Every day | ORAL | 11 refills | Status: AC

## 2024-08-03 NOTE — Telephone Encounter (Signed)
 LVMTCB to schedule pulmonary consult.

## 2024-08-03 NOTE — Progress Notes (Signed)
 New patient visit   Patient: Darrell Mcmahon   DOB: 03-15-2001   23 y.o. Male  MRN: [id number] Visit Date: 08/03/2024  Today's healthcare provider: LAURAINE LOISE BUOY, DO   Chief Complaint  Patient presents with   Establish Care    Would like to get referral for pulmonology- states sister works in healthcare and he has some symptoms that are similar to COPD Concerned with memory- father And sister both have MS and it is a concern for patient   Chest Pain    Patient presents with sharp left sided chest pain when taking a breath. States this has been going on since childhood, states it was never addressed by a doctor. States it is off and on.    Dizziness    Dizziness accompanies by nausea, accompanies chest pain and has noticed it since childhood. Also has never been addressed    memory concern    Reports memory concern of having trouble remembering things and forgets easily, states this has been going on for about 2 years.    Subjective    Darrell Mcmahon is a 23 y.o. male who presents today as a new patient to establish care.  Chest Pain  Associated symptoms include a cough (chronic, dry), dizziness and weakness (see HPI). Pertinent negatives include no abdominal pain, back pain, fever, headaches, nausea, palpitations, shortness of breath or vomiting.  Pertinent negatives for past medical history include no seizures.  Dizziness Associated symptoms include chest pain, coughing (chronic, dry) and weakness (see HPI). Pertinent negatives include no abdominal pain, arthralgias, chills, congestion, fatigue, fever, headaches, joint swelling, myalgias, nausea, rash or vomiting.   HPI     Establish Care    Additional comments: Would like to get referral for pulmonology- states sister works in healthcare and he has some symptoms that are similar to COPD Concerned with memory- father And sister both have MS and it is a concern for patient        Chest Pain    Additional comments: Patient  presents with sharp left sided chest pain when taking a breath. States this has been going on since childhood, states it was never addressed by a doctor. States it is off and on.         Dizziness    Additional comments: Dizziness accompanies by nausea, accompanies chest pain and has noticed it since childhood. Also has never been addressed         memory concern    Additional comments: Reports memory concern of having trouble remembering things and forgets easily, states this has been going on for about 2 years.       Last edited by Cherry Chiquita HERO, CMA on 08/03/2024  8:56 AM.       Darrell Mcmahon is a 23 year old male who presents with chest pain and concerns about COPD. He is accompanied by his fiance, Harlene and three-month old son, Ole.  He experiences chest pain that worsens when lying on his left side and during deep breaths. The pain is described as 'stagnant, like pressure' and becomes sharp with inhalation. It occurs randomly, approximately two to three times a week, and has recently been more persistent, lasting all day rather than just a few hours. The pain does not worsen with palpation. He has a history of being treated for reflux without benefit and has undergone cardiac evaluations including an echocardiogram and stress test, which were normal. A CT scan was ordered by a GI doctor to  evaluate for esophageal spasm versus alternative pathologies, but it was not completed.  He has a constant dry cough and attempts to clear phlegm from his throat unsuccessfully. The cough has been present for two to three years, coinciding with his move to the [place] from [place]  four years ago. He also reports fatigue, particularly when experiencing chest pain, and occasional dizziness accompanying the pain.   His family history is significant for multiple sclerosis in his father and sister. His father also has a history of reactive airway disease. He expresses concern about his  memory and other symptoms in relation to his family history of MS.  Socially, he works from home and engages in light physical activity, walking four times a week for about an hour. His diet has improved since the birth of his son, with increased intake of vegetables and protein, and reduced fast food consumption. He has a history of significant secondhand smoke exposure from his father, who smoked a pack a day, and lived near a pallet factory with suspected poor air quality.     Past Medical History:  Diagnosis Date   Allergy    History reviewed. No pertinent surgical history. Family Status  Relation Name Status   Father  (Not Specified)   Sister  (Not Specified)  No partnership data on file   Family History  Problem Relation Age of Onset   Multiple sclerosis Father    Multiple sclerosis Sister    Social History   Socioeconomic History   Marital status: Single    Spouse name: Not on file   Number of children: Not on file   Years of education: Not on file   Highest education level: GED or equivalent  Occupational History   Not on file  Tobacco Use   Smoking status: Never   Smokeless tobacco: Never  Substance and Sexual Activity   Alcohol use: Never   Drug use: Never   Sexual activity: Yes    Birth control/protection: I.U.D.  Other Topics Concern   Not on file  Social History Narrative   Not on file   Social Drivers of Health   Financial Resource Strain: Low Risk  (08/02/2024)   Overall Financial Resource Strain (CARDIA)    Difficulty of Paying Living Expenses: Not very hard  Food Insecurity: No Food Insecurity (08/02/2024)   Hunger Vital Sign    Worried About Running Out of Food in the Last Year: Never true    Ran Out of Food in the Last Year: Never true  Transportation Needs: No Transportation Needs (08/02/2024)   PRAPARE - Administrator, Civil Service (Medical): No    Lack of Transportation (Non-Medical): No  Physical Activity: Sufficiently Active  (08/02/2024)   Exercise Vital Sign    Days of Exercise per Week: 5 days    Minutes of Exercise per Session: 60 min  Stress: No Stress Concern Present (08/02/2024)   [employer] of Occupational Health - Occupational Stress Questionnaire    Feeling of Stress: Not at all  Social Connections: Moderately Isolated (08/02/2024)   Social Connection and Isolation Panel    Frequency of Communication with Friends and Family: Twice a week    Frequency of Social Gatherings with Friends and Family: Three times a week    Attends Religious Services: Never    Active Member of Clubs or Organizations: No    Attends Banker Meetings: Not on file    Marital Status: Living with partner   Outpatient Medications Prior  to Visit  Medication Sig   [DISCONTINUED] famotidine (PEPCID) 20 MG tablet Take 1 tablet by mouth 2 (two) times daily. (Patient not taking: Reported on 08/03/2024)   No facility-administered medications prior to visit.   Allergies  Allergen Reactions   Penicillins      There is no immunization history on file for this patient.  Health Maintenance  Topic Date Due   COVID-19 Vaccine (1 - 2024-25 season) 08/19/2024 (Originally 08/24/2023)   INFLUENZA VACCINE  03/22/2025 (Originally 07/23/2024)   DTaP/Tdap/Td (1 - Tdap) 08/03/2025 (Originally 04/19/2020)   HPV VACCINES (1 - Male 3-dose series) 08/03/2025 (Originally 04/19/2016)   Hepatitis C Screening  08/03/2025 (Originally 04/20/2019)   HIV Screening  08/03/2025 (Originally 04/19/2016)   Meningococcal B Vaccine (1 of 2 - Standard) 08/03/2025 (Originally 04/19/2017)   Hepatitis B Vaccines  Discontinued    Patient Care Team: Chelsi Warr, Lauraine SAILOR, DO as PCP - General (Family Medicine)  Review of Systems  Constitutional:  Negative for appetite change, chills, fatigue and fever.  HENT:  Positive for postnasal drip (consistently trying to clear phelgm from throat unsuccessfully). Negative for congestion, ear pain, hearing loss,  nosebleeds, rhinorrhea and trouble swallowing.   Eyes:  Negative for pain and visual disturbance.  Respiratory:  Positive for cough (chronic, dry). Negative for chest tightness and shortness of breath.   Cardiovascular:  Positive for chest pain. Negative for palpitations and leg swelling.  Gastrointestinal:  Negative for abdominal pain, blood in stool, constipation, diarrhea, nausea and vomiting.  Endocrine: Negative for polydipsia, polyphagia and polyuria.  Genitourinary:  Negative for dysuria and flank pain.  Musculoskeletal:  Negative for arthralgias, back pain, joint swelling, myalgias and neck stiffness.  Skin:  Negative for color change, rash and wound.  Neurological:  Positive for dizziness and weakness (see HPI). Negative for tremors, seizures, speech difficulty, light-headedness and headaches.  Psychiatric/Behavioral:  Positive for decreased concentration (difficulty with memory). Negative for behavioral problems, confusion, dysphoric mood and sleep disturbance. The patient is not nervous/anxious.   All other systems reviewed and are negative.       Objective    BP 114/69 (BP Location: Right Arm, Patient Position: Sitting, Cuff Size: Normal)   Pulse 70   Temp 98.6 F (37 C) (Oral)   Ht 5' 10.5 (1.791 m)   Wt 161 lb 8 oz (73.3 kg)   SpO2 100%   BMI 22.85 kg/m     Physical Exam Vitals and nursing note reviewed.  Constitutional:      General: He is awake.     Appearance: Normal appearance.  HENT:     Head: Normocephalic and atraumatic.     Right Ear: Tympanic membrane, ear canal and external ear normal.     Left Ear: Tympanic membrane, ear canal and external ear normal.     Nose: Nose normal.     Right Turbinates: Pale.     Left Turbinates: Pale.     Mouth/Throat:     Mouth: Mucous membranes are moist.     Pharynx: Oropharynx is clear. No oropharyngeal exudate or posterior oropharyngeal erythema.  Eyes:     General: No scleral icterus.    Extraocular Movements:  Extraocular movements intact.     Conjunctiva/sclera: Conjunctivae normal.     Pupils: Pupils are equal, round, and reactive to light.  Neck:     Thyroid: No thyromegaly or thyroid tenderness.  Cardiovascular:     Rate and Rhythm: Normal rate and regular rhythm.     Pulses: Normal pulses.  Heart sounds: Normal heart sounds.  Pulmonary:     Effort: Pulmonary effort is normal. No tachypnea, bradypnea or respiratory distress.     Breath sounds: Normal breath sounds. No stridor. No wheezing, rhonchi or rales.  Abdominal:     General: Bowel sounds are normal. There is no distension.     Palpations: Abdomen is soft. There is no mass.     Tenderness: There is no abdominal tenderness. There is no guarding.     Hernia: No hernia is present.  Musculoskeletal:     Cervical back: Normal range of motion and neck supple.     Right lower leg: No edema.     Left lower leg: No edema.  Lymphadenopathy:     Cervical: No cervical adenopathy.  Skin:    General: Skin is warm and dry.  Neurological:     Mental Status: He is alert and oriented to person, place, and time. Mental status is at baseline.  Psychiatric:        Mood and Affect: Mood normal.        Behavior: Behavior normal.     Depression Screen    08/03/2024    8:49 AM 05/11/2021    9:03 AM  PHQ 2/9 Scores  PHQ - 2 Score 0 0  PHQ- 9 Score 0    No results found for any visits on 08/03/24.  Assessment & Plan     Encounter for medical examination to establish care  Chronic cough -     Pulmonary Visit -     Loratadine ; Take 1 tablet (10 mg total) by mouth daily.  Dispense: 30 tablet; Refill: 11 -     Albuterol  Sulfate HFA; Inhale 2 puffs into the lungs every 6 (six) hours as needed for wheezing or shortness of breath.  Dispense: 8 g; Refill: 2  Memory difficulty -     Ambulatory referral to Neurology  Family history of multiple sclerosis -     Ambulatory referral to Neurology  Chronic fatigue -     VITAMIN D  25 Hydroxy  (Vit-D Deficiency, Fractures) -     TSH Rfx on Abnormal to Free T4 -     Vitamin B12 -     CBC -     Ambulatory referral to Neurology  Screening for endocrine, metabolic and immunity disorder -     Comprehensive metabolic panel with GFR  Encounter for screening for cardiovascular disorders -     Lipid panel  Encounter for hepatitis C screening test for low risk patient -     HCV Ab w Reflex to Quant PCR  Encounter for screening for HIV -     HIV Antibody (routine testing w rflx)      Chest pain with associated chronic cough and fatigue Intermittent chest pain with chronic cough and fatigue. Differential includes reactive airway disease and interstitial lung disease. Previous cardiac evaluations normal. No benefit from previous reflux treatment. - Start Claritin  for allergy management. - Prescribe albuterol  inhaler for use during coughing episodes. - Refer to pulmonology for further evaluation. - Consider pulmonary function test by pulmonology. - Educate on inhaler use and recommend spacer if needed.  Possible asthma or reactive airway disease Chronic dry cough and potential asthma symptoms, possibly triggered by allergens or secondhand smoke. Family history of reactive airway disease. - Refer to pulmonology for further evaluation, per patient preference. - Start Claritin  for allergy management. - Prescribe albuterol  inhaler for use during coughing episodes.  Memory concerns (possible cognitive  impairment) Concerns about memory possibly related to family history of multiple sclerosis. No current neurological symptoms reported. - Refer to neurology for evaluation and recommendation.  Encounter for medical examination to establish care Physical exam overall unremarkable except as noted above. Routine lab work ordered as noted. Discussion of vaccinations and general health screenings. No recent blood work. Uncertain HPV vaccination status. No recent tetanus vaccine. - Order  baseline blood work including hepatitis C and HIV testing. - Recommend updating tetanus vaccine, especially to protect his 9-month old son. - Discuss HPV vaccination status and offer vaccination if not previously completed.  Patient declined all vaccines today.     Return in about 1 year (around 08/03/2025), or if symptoms worsen or fail to improve, for CPE.     I discussed the assessment and treatment plan with the patient  The patient was provided an opportunity to ask questions and all were answered. The patient agreed with the plan and demonstrated an understanding of the instructions.   The patient was advised to call back or seek an in-person evaluation if the symptoms worsen or if the condition fails to improve as anticipated.    LAURAINE LOISE BUOY, DO  [facility] [phone] (phone) [phone] (fax)  [facility]

## 2024-08-04 LAB — CBC
Hematocrit: 48.1 % (ref 37.5–51.0)
Hemoglobin: 15.8 g/dL (ref 13.0–17.7)
MCH: 29 pg (ref 26.6–33.0)
MCHC: 32.8 g/dL (ref 31.5–35.7)
MCV: 88 fL (ref 79–97)
Platelets: 191 x10E3/uL (ref 150–450)
RBC: 5.44 x10E6/uL (ref 4.14–5.80)
RDW: 12.8 % (ref 11.6–15.4)
WBC: 4.2 x10E3/uL (ref 3.4–10.8)

## 2024-08-04 LAB — COMPREHENSIVE METABOLIC PANEL WITH GFR
ALT: 30 IU/L (ref 0–44)
AST: 22 IU/L (ref 0–40)
Albumin: 5 g/dL (ref 4.3–5.2)
Alkaline Phosphatase: 80 IU/L (ref 44–121)
BUN/Creatinine Ratio: 14 (ref 9–20)
BUN: 14 mg/dL (ref 6–20)
Bilirubin Total: 0.8 mg/dL (ref 0.0–1.2)
CO2: 21 mmol/L (ref 20–29)
Calcium: 9.7 mg/dL (ref 8.7–10.2)
Chloride: 102 mmol/L (ref 96–106)
Creatinine, Ser: 1.01 mg/dL (ref 0.76–1.27)
Globulin, Total: 2.4 g/dL (ref 1.5–4.5)
Glucose: 88 mg/dL (ref 70–99)
Potassium: 4.3 mmol/L (ref 3.5–5.2)
Sodium: 140 mmol/L (ref 134–144)
Total Protein: 7.4 g/dL (ref 6.0–8.5)
eGFR: 107 mL/min/1.73 (ref >59)

## 2024-08-04 LAB — LIPID PANEL
Chol/HDL Ratio: 3.3 ratio (ref 0.0–5.0)
Cholesterol, Total: 150 mg/dL (ref 100–199)
HDL: 46 mg/dL (ref >39)
LDL Chol Calc (NIH): 87 mg/dL (ref 0–99)
Triglycerides: 88 mg/dL (ref 0–149)
VLDL Cholesterol Cal: 17 mg/dL (ref 5–40)

## 2024-08-04 LAB — HIV ANTIBODY (ROUTINE TESTING W REFLEX): HIV Screen 4th Generation wRfx: NONREACTIVE

## 2024-08-04 LAB — HCV AB W REFLEX TO QUANT PCR: HCV Ab: NONREACTIVE

## 2024-08-04 LAB — VITAMIN D 25 HYDROXY (VIT D DEFICIENCY, FRACTURES): Vit D, 25-Hydroxy: 26.9 ng/mL — ABNORMAL LOW (ref 30.0–100.0)

## 2024-08-04 LAB — HCV INTERPRETATION

## 2024-08-04 LAB — TSH RFX ON ABNORMAL TO FREE T4: TSH: 3.7 u[IU]/mL (ref 0.450–4.500)

## 2024-08-04 LAB — VITAMIN B12: Vitamin B-12: 531 pg/mL (ref 232–1245)

## 2024-08-05 ENCOUNTER — Other Ambulatory Visit (HOSPITAL_COMMUNITY): Payer: Self-pay

## 2024-08-09 ENCOUNTER — Ambulatory Visit: Payer: Self-pay | Admitting: Family Medicine

## 2024-09-06 ENCOUNTER — Encounter: Payer: Self-pay | Admitting: Pulmonary Disease

## 2024-09-06 ENCOUNTER — Ambulatory Visit (INDEPENDENT_AMBULATORY_CARE_PROVIDER_SITE_OTHER): Admitting: Pulmonary Disease

## 2024-09-06 VITALS — BP 136/82 | HR 76 | Temp 97.9°F | Ht 70.5 in | Wt 164.4 lb

## 2024-09-06 DIAGNOSIS — R0600 Dyspnea, unspecified: Secondary | ICD-10-CM

## 2024-09-06 DIAGNOSIS — R053 Chronic cough: Secondary | ICD-10-CM | POA: Diagnosis not present

## 2024-09-06 DIAGNOSIS — R0989 Other specified symptoms and signs involving the circulatory and respiratory systems: Secondary | ICD-10-CM

## 2024-09-06 DIAGNOSIS — R0602 Shortness of breath: Secondary | ICD-10-CM | POA: Insufficient documentation

## 2024-09-06 DIAGNOSIS — R079 Chest pain, unspecified: Secondary | ICD-10-CM

## 2024-09-06 DIAGNOSIS — R0902 Hypoxemia: Secondary | ICD-10-CM

## 2024-09-06 LAB — NITRIC OXIDE: Nitric Oxide: 17

## 2024-09-06 MED ORDER — BUDESONIDE-FORMOTEROL FUMARATE 80-4.5 MCG/ACT IN AERO: 2.0000 | INHALATION_SPRAY | Freq: Two times a day (BID) | RESPIRATORY_TRACT | 12 refills | Status: AC

## 2024-09-06 NOTE — Progress Notes (Signed)
 Subjective:    Patient ID: Darrell Mcmahon, male    DOB: October 24, 2001, 23 y.o.   MRN: [id number]  Patient Care Team: Donzella Lauraine SAILOR, DO as PCP - General [place] [facility])  Chief Complaint  Patient presents with   Consult    Dry cough. Occasional shortness of breath with cough.     BACKGROUND: Patient is a 23 year old lifelong never smoker albeit with prior secondhand smoke exposure, who presents for evaluation of shortness of breath, pleuritic chest pain and cough that has been present on an intermittent basis since childhood.  He is kindly referred by Dr. Lauraine Donzella   HPI Discussed the use of AI scribe software for clinical note transcription with the patient, who gave verbal consent to proceed.  History of Present Illness   Darrell Mcmahon is a 23 year old male who presents with a chronic dry cough. He is accompanied by his significant other, Harlene and their four-month-old child, Ole. He was referred for evaluation of a chronic dry cough.  He has experienced a chronic dry cough since childhood, which is persistent and non-productive, without wheezing or sputum production. The cough worsens with exposure to cold air and certain chemicals. He has a history of secondhand smoke exposure from his father and lived near a pallet factory with significant wood dust exposure. He has no history of smoking himself.  He experiences left-sided chest pain, described as a constant, flat pain that intensifies with deep breathing. This pain can last from a few hours to an entire day and is sometimes accompanied by shortness of breath severe enough to prevent him from getting out of bed. No history of wheezing, reflux, heartburn, nasal congestion, fevers, chills, or joint pains.  He has tried using an inhaler recently prescribed to him (albuterol ), but found it ineffective and discontinued its use after about a week and a half. He has no known allergies except for penicillin. His family history  includes his father recently being hospitalized for reactive airway disease; the patient does not believe there is a family history of emphysema.  He had cardiac evaluation in 2023 by Dr. Florencio at [facility].  This Included Echocardiogram which was normal, cardiac stress test and Holter monitor.  He was treated for presumed costochondritis.  No improvement in symptoms.  Has been treated in the past for presumed GERD any significant improvement in his symptoms. 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  There is 1 pet cat in the home but no other pets or animals.     Review of Systems A 10 point review of systems was performed and it is as noted above otherwise negative.   Past Medical History:  Diagnosis Date   Allergy     History reviewed. No pertinent surgical history.  Patient Active Problem List   Diagnosis Date Noted   Shortness of breath 09/06/2024   Persistent dry cough 09/06/2024   Chest pain 05/11/2021    Family History  Problem Relation Age of Onset   Multiple sclerosis Father    Multiple sclerosis Sister     Social History   Tobacco Use   Smoking status: Never   Smokeless tobacco: Never  Substance Use Topics   Alcohol use: Never    Allergies  Allergen Reactions   Penicillins     Current Meds  Medication Sig   budesonide -formoterol  (SYMBICORT ) 80-4.5 MCG/ACT inhaler Inhale 2 puffs into the lungs 2 (two) times daily.  There is no immunization history on file for this patient.      Objective:     BP 136/82   Pulse 76   Temp 97.9 F (36.6 C) (Temporal)   Ht 5' 10.5 (1.791 m)   Wt 164 lb 6.4 oz (74.6 kg)   SpO2 97%   BMI 23.26 kg/m   SpO2: 97 %  GENERAL: Well-developed, well-nourished gentleman, no acute distress.  Fully ambulatory.  No conversational dyspnea. HEAD: Normocephalic, atraumatic.  EYES: Pupils equal, round, reactive to light.  No scleral  icterus.  MOUTH: Dentition intact, oral mucosa moist NECK: Supple. No thyromegaly. Trachea midline. No JVD.  No adenopathy. PULMONARY: Good air entry bilaterally.  No adventitious sounds. CARDIOVASCULAR: S1 and S2. Regular rate and rhythm.  Cards normal. ABDOMEN: Benign. MUSCULOSKELETAL: No joint deformity, no clubbing, no edema.  NEUROLOGIC: No overt focal deficit, no gait disturbance, speech is fluent. SKIN: Intact,warm,dry. PSYCH: Mood and behavior normal.  Lab Results  Component Value Date   NITRICOXIDE 17 09/06/2024  *No evidence of type II inflammation.  Ambulatory oxymetry was performed today:  At rest on room air oxygen saturation was 99%, the patient ambulated at a normal pace, completed 3 laps, O2 nadir 93%, no shortness of breath.  Resting heart rate was 69 bpm at maximum for this exercise 95 bpm. There is evidence of mild desaturation with exercise.       Assessment & Plan:     ICD-10-CM   1. Chronic cough  R05.3 Nitric oxide     2. Chest pain, unspecified type  R07.9     3. Hyperinflation of lungs  R09.89 Pulmonary function test    Alpha-1 antitrypsin phenotype    CANCELED: Alpha-1 antitrypsin phenotype    4. Shortness of breath  R06.02 Pulmonary function test    Alpha-1 antitrypsin phenotype    Nitric oxide     CANCELED: Alpha-1 antitrypsin phenotype    5. Exercise hypoxemia - mild, relative  R09.02       Orders Placed This Encounter  Procedures   Alpha-1 antitrypsin phenotype    Standing Status:   Future    Expiration Date:   09/06/2025   Nitric oxide    Pulmonary function test    Standing Status:   Future    Expiration Date:   09/06/2025    Where should this test be performed?:   Outpatient Pulmonary    What type of PFT is being ordered?:   Full PFT    Meds ordered this encounter  Medications   budesonide -formoterol  (SYMBICORT ) 80-4.5 MCG/ACT inhaler    Sig: Inhale 2 puffs into the lungs 2 (two) times daily.    Dispense:  1 each    Refill:  12    Discussion:    Chronic dry cough with dyspnea and chest pain Chronic dry cough since childhood, associated with dyspnea and left-sided chest pain, exacerbated by cold air and certain chemicals. Significant secondhand smoke exposure and environmental exposure to wood dust. No wheezing or sputum production. Previous inhaler use without benefit. Normal nitric oxide  test. - Order pulmonary function tests to assess lung function - Order blood work to evaluate for underlying conditions (alpha 1 antitrypsin) - Trial of daily inhaler to manage symptoms (Symbicort  80/4.5, 2 inhalations twice a day) - Monitor response to inhaler and adjust treatment as necessary  Possible cough variant asthma Chronic cough without wheezing suggests cough variant asthma. Previous inhaler trial was ineffective. No significant airway inflammation. Differential includes environmental exposures and hereditary conditions. - Trial of  daily inhaler to assess response - Re-evaluate in 4-6 weeks to assess effectiveness of inhaler  Possible hereditary emphysema Considered due to hyperinflated lungs on previous x-ray and family history of reactive airway disease. No family history of emphysema reported however, patient is unsure. - Order blood test to evaluate for hereditary emphysema (alpha-1 antitrypsin type) - Consider CT scan if pulmonary function tests and blood tests indicate further investigation is needed     Advised if symptoms do not improve or worsen, to please contact office for sooner follow up or seek emergency care.    I spent 62 minutes of dedicated to the care of this patient on the date of this encounter to include pre-visit review of records, face-to-face time with the patient discussing conditions above, post visit ordering of testing, clinical documentation with the electronic health record, making appropriate referrals as documented, and communicating necessary findings to members of the patients care  team.   C. Leita Sanders, MD Advanced Bronchoscopy [facility]    *This note was dictated using voice recognition software/Dragon.  Despite best efforts to proofread, errors can occur which can change the meaning. Any transcriptional errors that result from this process are unintentional and may not be fully corrected at the time of dictation.

## 2024-09-06 NOTE — Patient Instructions (Signed)
 VISIT SUMMARY:  Today, you were seen for a chronic dry cough that you have had since childhood. We discussed your symptoms, including the chest pain and shortness of breath you experience, and reviewed your history of secondhand smoke and wood dust exposure. We also talked about your previous use of an inhaler and your family history of respiratory issues.  YOUR PLAN:  -CHRONIC DRY COUGH WITH DYSPNEA AND CHEST PAIN: A chronic dry cough is a persistent cough that does not produce mucus. Dyspnea means difficulty breathing, and chest pain can be a symptom of various conditions. We will conduct pulmonary function tests and blood work to understand your lung function and check for underlying conditions. You will also start a daily inhaler to manage your symptoms, and we will monitor your response to this treatment.  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 We will trial a daily inhaler to see if it helps with your symptoms and re-evaluate in 4-6 weeks to assess its effectiveness.  -POSSIBLE HEREDITARY EMPHYSEMA: Hereditary emphysema is a genetic condition that can cause lung damage. Given your family history and previous x-ray results, we will order a blood test to check for this condition. If needed, we may also consider a CT scan based on the results of your pulmonary function tests and blood work.  INSTRUCTIONS:  Please complete the pulmonary function tests and blood work as soon as possible. Start using the daily inhaler as prescribed and monitor your symptoms. We will re-evaluate your condition in 4-6 weeks to see how you are responding to the inhaler. If you experience any worsening of symptoms or new symptoms, please contact our office immediately.

## 2024-09-08 ENCOUNTER — Other Ambulatory Visit
Admission: RE | Admit: 2024-09-08 | Discharge: 2024-09-08 | Disposition: A | Discharge status: Home / Self Care | Attending: Pulmonary Disease | Admitting: Pulmonary Disease

## 2024-09-08 DIAGNOSIS — R0989 Other specified symptoms and signs involving the circulatory and respiratory systems: Secondary | ICD-10-CM | POA: Diagnosis present

## 2024-09-08 DIAGNOSIS — R0602 Shortness of breath: Secondary | ICD-10-CM | POA: Insufficient documentation

## 2024-09-11 LAB — ALPHA-1-ANTITRYPSIN PHENOTYP: A-1 Antitrypsin, Ser: 138 mg/dL (ref 95–164)

## 2024-09-29 ENCOUNTER — Encounter

## 2024-10-01 ENCOUNTER — Ambulatory Visit: Admitting: Pulmonary Disease

## 2024-12-09 ENCOUNTER — Encounter

## 2024-12-09 ENCOUNTER — Ambulatory Visit: Admitting: Pulmonary Disease

## 2025-08-04 ENCOUNTER — Encounter: Admitting: Family Medicine
